# Patient Record
Sex: Female | Born: 1935 | Race: Black or African American | Hispanic: No | Smoking: Never smoker
Health system: Southern US, Community
[De-identification: ages and names within clinical notes are randomized; demographics above are authoritative.]

## PROBLEM LIST (undated history)

## (undated) DIAGNOSIS — E119 Type 2 diabetes mellitus without complications: Secondary | ICD-10-CM

## (undated) DIAGNOSIS — C801 Malignant (primary) neoplasm, unspecified: Secondary | ICD-10-CM

## (undated) DIAGNOSIS — I10 Essential (primary) hypertension: Secondary | ICD-10-CM

## (undated) HISTORY — PX: ABDOMINAL HYSTERECTOMY: SHX81

## (undated) HISTORY — PX: COLONOSCOPY: SHX174

---

## 2017-06-08 ENCOUNTER — Encounter (HOSPITAL_COMMUNITY): Payer: Self-pay

## 2017-06-08 ENCOUNTER — Other Ambulatory Visit: Payer: Self-pay

## 2017-06-08 ENCOUNTER — Observation Stay (HOSPITAL_COMMUNITY)
Admission: EM | Admit: 2017-06-08 | Discharge: 2017-06-09 | Disposition: A | Discharge status: Home / Self Care | Payer: Medicare Other | Attending: Internal Medicine | Admitting: Internal Medicine

## 2017-06-08 ENCOUNTER — Emergency Department (HOSPITAL_COMMUNITY): Payer: Medicare Other

## 2017-06-08 DIAGNOSIS — I4510 Unspecified right bundle-branch block: Secondary | ICD-10-CM | POA: Insufficient documentation

## 2017-06-08 DIAGNOSIS — E876 Hypokalemia: Secondary | ICD-10-CM

## 2017-06-08 DIAGNOSIS — Z794 Long term (current) use of insulin: Secondary | ICD-10-CM | POA: Insufficient documentation

## 2017-06-08 DIAGNOSIS — E785 Hyperlipidemia, unspecified: Secondary | ICD-10-CM | POA: Diagnosis not present

## 2017-06-08 DIAGNOSIS — C541 Malignant neoplasm of endometrium: Secondary | ICD-10-CM | POA: Diagnosis present

## 2017-06-08 DIAGNOSIS — Z90710 Acquired absence of both cervix and uterus: Secondary | ICD-10-CM | POA: Insufficient documentation

## 2017-06-08 DIAGNOSIS — Z8542 Personal history of malignant neoplasm of other parts of uterus: Secondary | ICD-10-CM | POA: Insufficient documentation

## 2017-06-08 DIAGNOSIS — D899 Disorder involving the immune mechanism, unspecified: Secondary | ICD-10-CM | POA: Diagnosis not present

## 2017-06-08 DIAGNOSIS — R079 Chest pain, unspecified: Secondary | ICD-10-CM | POA: Diagnosis present

## 2017-06-08 DIAGNOSIS — E119 Type 2 diabetes mellitus without complications: Secondary | ICD-10-CM | POA: Diagnosis not present

## 2017-06-08 DIAGNOSIS — R0789 Other chest pain: Secondary | ICD-10-CM | POA: Diagnosis present

## 2017-06-08 DIAGNOSIS — I10 Essential (primary) hypertension: Secondary | ICD-10-CM | POA: Diagnosis not present

## 2017-06-08 DIAGNOSIS — K209 Esophagitis, unspecified without bleeding: Secondary | ICD-10-CM

## 2017-06-08 DIAGNOSIS — Z79899 Other long term (current) drug therapy: Secondary | ICD-10-CM | POA: Insufficient documentation

## 2017-06-08 HISTORY — DX: Malignant (primary) neoplasm, unspecified: C80.1

## 2017-06-08 HISTORY — DX: Type 2 diabetes mellitus without complications: E11.9

## 2017-06-08 HISTORY — DX: Essential (primary) hypertension: I10

## 2017-06-08 LAB — BASIC METABOLIC PANEL
ANION GAP: 14 (ref 5–15)
BUN: 12 mg/dL (ref 6–20)
CO2: 20 mmol/L — ABNORMAL LOW (ref 22–32)
Calcium: 8.8 mg/dL — ABNORMAL LOW (ref 8.9–10.3)
Chloride: 97 mmol/L — ABNORMAL LOW (ref 101–111)
Creatinine, Ser: 0.94 mg/dL (ref 0.44–1.00)
GFR calc Af Amer: >60 mL/min (ref >60)
GFR, EST NON AFRICAN AMERICAN: 55 mL/min — AB (ref >60)
Glucose, Bld: 308 mg/dL — ABNORMAL HIGH (ref 65–99)
POTASSIUM: 3.2 mmol/L — AB (ref 3.5–5.1)
SODIUM: 131 mmol/L — AB (ref 135–145)

## 2017-06-08 LAB — I-STAT TROPONIN, ED: TROPONIN I, POC: 0.01 ng/mL (ref 0.00–0.08)

## 2017-06-08 LAB — CBC
HEMATOCRIT: 33.1 % — AB (ref 36.0–46.0)
HEMOGLOBIN: 11.7 g/dL — AB (ref 12.0–15.0)
MCH: 28.4 pg (ref 26.0–34.0)
MCHC: 35.3 g/dL (ref 30.0–36.0)
MCV: 80.3 fL (ref 78.0–100.0)
Platelets: 258 10*3/uL (ref 150–400)
RBC: 4.12 MIL/uL (ref 3.87–5.11)
RDW: 13.2 % (ref 11.5–15.5)
WBC: 1.7 10*3/uL — AB (ref 4.0–10.5)

## 2017-06-08 LAB — CBG MONITORING, ED: Glucose-Capillary: 260 mg/dL — ABNORMAL HIGH (ref 65–99)

## 2017-06-08 MED ORDER — ATORVASTATIN CALCIUM 10 MG PO TABS
10.0000 mg | ORAL_TABLET | Freq: Every day | ORAL | Status: DC
  Administered 2017-06-09: 10 mg via ORAL
  Filled 2017-06-08: qty 1

## 2017-06-08 MED ORDER — INSULIN ASPART 100 UNIT/ML ~~LOC~~ SOLN
0.0000 [IU] | SUBCUTANEOUS | Status: DC
  Administered 2017-06-09: 3 [IU] via SUBCUTANEOUS
  Administered 2017-06-09: 5 [IU] via SUBCUTANEOUS
  Administered 2017-06-09: 3 [IU] via SUBCUTANEOUS
  Filled 2017-06-08 (×2): qty 1

## 2017-06-08 MED ORDER — LEVOTHYROXINE SODIUM 75 MCG PO TABS
75.0000 ug | ORAL_TABLET | Freq: Every day | ORAL | Status: DC
  Administered 2017-06-09: 75 ug via ORAL
  Filled 2017-06-08: qty 1

## 2017-06-08 MED ORDER — BACLOFEN 10 MG PO TABS: 10.0000 mg | ORAL_TABLET | Freq: Every day | ORAL | Status: DC | PRN

## 2017-06-08 MED ORDER — SUCRALFATE 1 G PO TABS
1.0000 g | ORAL_TABLET | Freq: Once | ORAL | Status: AC
  Administered 2017-06-08: 1 g via ORAL
  Filled 2017-06-08: qty 1

## 2017-06-08 MED ORDER — OXYCODONE HCL 5 MG PO TABS: 5.0000 mg | ORAL_TABLET | Freq: Three times a day (TID) | ORAL | Status: DC | PRN

## 2017-06-08 MED ORDER — HYDROCHLOROTHIAZIDE 12.5 MG PO CAPS
12.5000 mg | ORAL_CAPSULE | Freq: Every day | ORAL | Status: DC
  Administered 2017-06-09: 12.5 mg via ORAL
  Filled 2017-06-08: qty 1

## 2017-06-08 NOTE — ED Triage Notes (Signed)
Pt states that she has had a pressure in the center of her chest since breakfast this AM, denies any other cardiac symptoms.

## 2017-06-09 ENCOUNTER — Encounter (HOSPITAL_COMMUNITY): Payer: Self-pay | Admitting: Internal Medicine

## 2017-06-09 DIAGNOSIS — R0789 Other chest pain: Secondary | ICD-10-CM

## 2017-06-09 DIAGNOSIS — R079 Chest pain, unspecified: Secondary | ICD-10-CM | POA: Diagnosis present

## 2017-06-09 DIAGNOSIS — E119 Type 2 diabetes mellitus without complications: Secondary | ICD-10-CM

## 2017-06-09 DIAGNOSIS — E785 Hyperlipidemia, unspecified: Secondary | ICD-10-CM

## 2017-06-09 DIAGNOSIS — E876 Hypokalemia: Secondary | ICD-10-CM | POA: Diagnosis not present

## 2017-06-09 DIAGNOSIS — I10 Essential (primary) hypertension: Secondary | ICD-10-CM

## 2017-06-09 DIAGNOSIS — C541 Malignant neoplasm of endometrium: Secondary | ICD-10-CM | POA: Diagnosis not present

## 2017-06-09 LAB — CBC
HCT: 31.5 % — ABNORMAL LOW (ref 36.0–46.0)
Hemoglobin: 11.1 g/dL — ABNORMAL LOW (ref 12.0–15.0)
MCH: 28.4 pg (ref 26.0–34.0)
MCHC: 35.2 g/dL (ref 30.0–36.0)
MCV: 80.6 fL (ref 78.0–100.0)
Platelets: 216 10*3/uL (ref 150–400)
RBC: 3.91 MIL/uL (ref 3.87–5.11)
RDW: 13 % (ref 11.5–15.5)
WBC: 1.9 10*3/uL — AB (ref 4.0–10.5)

## 2017-06-09 LAB — BASIC METABOLIC PANEL
ANION GAP: 12 (ref 5–15)
BUN: 11 mg/dL (ref 6–20)
CO2: 23 mmol/L (ref 22–32)
Calcium: 8.5 mg/dL — ABNORMAL LOW (ref 8.9–10.3)
Chloride: 98 mmol/L — ABNORMAL LOW (ref 101–111)
Creatinine, Ser: 0.84 mg/dL (ref 0.44–1.00)
Glucose, Bld: 199 mg/dL — ABNORMAL HIGH (ref 65–99)
POTASSIUM: 3.1 mmol/L — AB (ref 3.5–5.1)
Sodium: 133 mmol/L — ABNORMAL LOW (ref 135–145)

## 2017-06-09 LAB — DIFFERENTIAL
Basophils Relative: 2 %
Eosinophils Relative: 6 %
Lymphocytes Relative: 61 %
MONOS PCT: 17 %
Neutrophils Relative %: 14 %

## 2017-06-09 LAB — GLUCOSE, CAPILLARY
GLUCOSE-CAPILLARY: 216 mg/dL — AB (ref 65–99)
Glucose-Capillary: 208 mg/dL — ABNORMAL HIGH (ref 65–99)

## 2017-06-09 LAB — I-STAT TROPONIN, ED: TROPONIN I, POC: 0 ng/mL (ref 0.00–0.08)

## 2017-06-09 LAB — CBG MONITORING, ED: GLUCOSE-CAPILLARY: 215 mg/dL — AB (ref 65–99)

## 2017-06-09 LAB — TROPONIN I: Troponin I: <0.03 ng/mL (ref <0.03)

## 2017-06-09 MED ORDER — POTASSIUM CHLORIDE CRYS ER 20 MEQ PO TBCR
40.0000 meq | EXTENDED_RELEASE_TABLET | Freq: Once | ORAL | Status: AC
  Administered 2017-06-09: 40 meq via ORAL
  Filled 2017-06-09: qty 2

## 2017-06-09 MED ORDER — ONDANSETRON HCL 4 MG/2ML IJ SOLN: 4.0000 mg | Freq: Four times a day (QID) | INTRAMUSCULAR | Status: DC | PRN

## 2017-06-09 MED ORDER — ENOXAPARIN SODIUM 40 MG/0.4ML ~~LOC~~ SOLN
40.0000 mg | SUBCUTANEOUS | Status: DC
  Administered 2017-06-09: 40 mg via SUBCUTANEOUS
  Filled 2017-06-09: qty 0.4

## 2017-06-09 MED ORDER — GI COCKTAIL ~~LOC~~
30.0000 mL | Freq: Three times a day (TID) | ORAL | Status: DC | PRN
  Administered 2017-06-09: 30 mL via ORAL
  Filled 2017-06-09: qty 30

## 2017-06-09 MED ORDER — ACETAMINOPHEN 325 MG PO TABS: 650.0000 mg | ORAL_TABLET | ORAL | Status: DC | PRN

## 2017-06-09 NOTE — Consult Note (Addendum)
Cardiology Consultation:   Patient ID: Natasha Lynch; [id number]; [birth year]   Admit date: 06/08/2017 Date of Consult: 06/09/2017  Primary Care Provider: Patient, No Pcp Per Primary Cardiologist: Dr. Domenic Polite - new Primary Electrophysiologist:     Patient Profile:   Natasha Lynch is a 84 y.o. female with a hx of HTN, DM, and endometrial cancer who is being seen today for the evaluation of chest pain at the request of Dr. Maylene Roes.  History of Present Illness:   Natasha Lynch doe not currently follow with cardiology. She denies previous stress test, heart cath, and open heart surgery. She presented to [facility] with onset of substernal chest pani when she swallows. Cardiology asked to evaluate chest pain.   On my interview, the patient states she started having pain right after a meal yesterday. She has discomfort in her lower chest, she denies "chest pain."  It only hurts when she swallows and quickly resolves after the food reaches her stomach. She denies shortness of breath, lower extremity edema, bleeding problems, recent illness, dizziness, and feelings of syncope. She has not fallen recently. She underwent hysterectomy 1 month ago and is recovering from that surgery. She had her last chemo infusion about 8 days prior.   Troponin x 4 negative, EKG with nonspecific ST changes, no signs of ischemia. CXR negative for acute processes.    Past Medical History:  Diagnosis Date  . Cancer ([place])    endometrial   . Diabetes mellitus without complication ([place])   . Hypertension     Past Surgical History:  Procedure Laterality Date  . ABDOMINAL HYSTERECTOMY    . COLONOSCOPY       Home Medications:  Prior to Admission medications   Medication Sig Start Date End Date Taking? Authorizing Provider  atorvastatin (LIPITOR) 10 MG tablet Take 10 mg by mouth daily. 03/06/17  Yes [provider]  baclofen (LIORESAL) 10 MG tablet Take 10 mg by mouth daily as needed for muscle spasms.  05/17/17  Yes  [provider]  CALCIUM PO Take 1 tablet by mouth daily.   Yes [provider]  Cyanocobalamin (VITAMIN B-12 PO) Take 1 tablet by mouth daily.   Yes [provider]  dexamethasone (DECADRON) 4 MG tablet Take 20 mg by mouth See admin instructions. Take 5 tablets (20 mg) by mouth 12 hours and 6 hours prior to day of chemotherapy 05/29/17  Yes [provider]  glipiZIDE (GLUCOTROL XL) 10 MG 24 hr tablet Take 10 mg by mouth daily. 05/28/17  Yes [provider]  hydrochlorothiazide (MICROZIDE) 12.5 MG capsule Take 12.5 mg by mouth daily. 04/22/17  Yes [provider]  ibuprofen (ADVIL,MOTRIN) 600 MG tablet Take 600 mg by mouth every 6 (six) hours as needed (pain).  05/10/17  Yes [provider]  levothyroxine (SYNTHROID, LEVOTHROID) 75 MCG tablet Take 75 mcg by mouth daily before breakfast. 04/04/17  Yes [provider]  ondansetron (ZOFRAN) 8 MG tablet Take 8 mg by mouth See admin instructions. Dissolve one tablet (8 mg)under the tongue the night of chemo, then take twice daily for 3 days, continue with one tablet (8 mg) three times daily as needed for nausea and vomiting 05/29/17  Yes [provider]  oxyCODONE (OXY IR/ROXICODONE) 5 MG immediate release tablet Take 5 mg by mouth every 8 (eight) hours as needed (pain).  05/10/17  Yes [provider]  prochlorperazine (COMPAZINE) 10 MG tablet Take 10 mg by mouth every 6 (six) hours as needed for nausea or  vomiting.  05/29/17  Yes [provider]    Inpatient Medications: Scheduled Meds: . atorvastatin  10 mg Oral Daily  . enoxaparin (LOVENOX) injection  40 mg Subcutaneous Q24H  . hydrochlorothiazide  12.5 mg Oral Daily  . insulin aspart  0-9 Units Subcutaneous Q4H  . levothyroxine  75 mcg Oral QAC breakfast   Continuous Infusions:  PRN Meds: acetaminophen, baclofen, gi cocktail, ondansetron (ZOFRAN) IV, oxyCODONE  Allergies:   No Known Allergies  Social  History:   Social History   Socioeconomic History  . Marital status: Widowed    Spouse name: Not on file  . Number of children: Not on file  . Years of education: Not on file  . Highest education level: Not on file  Occupational History  . Not on file  Social Needs  . Financial resource strain: Not on file  . Food insecurity:    Worry: Not on file    Inability: Not on file  . Transportation needs:    Medical: Not on file    Non-medical: Not on file  Tobacco Use  . Smoking status: Never Smoker  . Smokeless tobacco: Never Used  Substance and Sexual Activity  . Alcohol use: Never    Frequency: Never  . Drug use: Never  . Sexual activity: Not on file  Lifestyle  . Physical activity:    Days per week: Not on file    Minutes per session: Not on file  . Stress: Not on file  Relationships  . Social connections:    Talks on phone: Not on file    Gets together: Not on file    Attends religious service: Not on file    Active member of club or organization: Not on file    Attends meetings of clubs or organizations: Not on file    Relationship status: Not on file  . Intimate partner violence:    Fear of current or ex partner: Not on file    Emotionally abused: Not on file    Physically abused: Not on file    Forced sexual activity: Not on file  Other Topics Concern  . Not on file  Social History Narrative  . Not on file    Family History:    Family History  Problem Relation Age of Onset  . Diabetes Brother   . Lung cancer Brother      ROS:  Please see the history of present illness.   All other ROS reviewed and negative.     Physical Exam/Data:   Vitals:   06/09/17 0400 06/09/17 0500 06/09/17 0544 06/09/17 0549  BP: 129/69 (!) 159/89 (!) 155/70   Pulse: 73 80 85   Resp: 16 18 18    Temp:   98.9 F (37.2 C)   TempSrc:   Oral   SpO2: 97% 94% 96%   Weight:    182 lb 9.6 oz (82.8 kg)  Height:    5\' 8"  (1.727 m)    Intake/Output Summary (Last 24 hours) at  06/09/2017 0716 Last data filed at 06/09/2017 0600 Gross per 24 hour  Intake -  Output 300 ml  Net -300 ml   Filed Weights   06/08/17 2252 06/09/17 0549  Weight: 193 lb (87.5 kg) 182 lb 9.6 oz (82.8 kg)   Body mass index is 27.76 kg/m.  General:  Well nourished, well developed, in no acute distress HEENT: normal Neck: no JVD Vascular: No carotid bruits Cardiac:  normal S1, S2; RRR; no murmur Lungs:  clear to auscultation bilaterally, no wheezing, rhonchi or rales  Abd: soft, nontender, no hepatomegaly  Ext: no edema Musculoskeletal:  No deformities, BUE and BLE strength normal and equal Skin: warm and dry  Neuro:  CNs 2-12 intact, no focal abnormalities noted Psych:  Normal affect   EKG:  The EKG was personally reviewed and demonstrates:  Sinus, nonspecific ST changes, LVH, no prior to compare (LVH mentioned previously) Telemetry:  Telemetry was personally reviewed and demonstrates:  sinus  Relevant CV Studies:  None  Laboratory Data:  Chemistry Recent Labs  Lab 06/08/17 2014 06/09/17 0528  NA 131* 133*  K 3.2* 3.1*  CL 97* 98*  CO2 20* 23  GLUCOSE 308* 199*  BUN 12 11  CREATININE 0.94 0.84  CALCIUM 8.8* 8.5*  GFRNONAA 55* >60  GFRAA >60 >60  ANIONGAP 14 12    No results for input(s): PROT, ALBUMIN, AST, ALT, ALKPHOS, BILITOT in the last 168 hours. Hematology Recent Labs  Lab 06/08/17 2014 06/09/17 0528  WBC 1.7* 1.9*  RBC 4.12 3.91  HGB 11.7* 11.1*  HCT 33.1* 31.5*  MCV 80.3 80.6  MCH 28.4 28.4  MCHC 35.3 35.2  RDW 13.2 13.0  PLT 258 216   Cardiac Enzymes Recent Labs  Lab 06/09/17 0022 06/09/17 0528  TROPONINI <0.03 <0.03    Recent Labs  Lab 06/08/17 2059 06/08/17 2350  TROPIPOC 0.01 0.00    BNPNo results for input(s): BNP, PROBNP in the last 168 hours.  DDimer No results for input(s): DDIMER in the last 168 hours.  Radiology/Studies:  Dg Chest 2 View  Result Date: 06/08/2017 CLINICAL DATA:  Chest pain EXAM: CHEST - 2 VIEW  COMPARISON:  None. FINDINGS: The heart size and mediastinal contours are within normal limits. Both lungs are clear. Mild degenerative changes of the spine. Prominent spur or possible loose body inferior to the right acromion. IMPRESSION: No active cardiopulmonary disease. Electronically Signed   By: Donavan Foil M.D.   On: 06/08/2017 20:30    Assessment and Plan:   1. Chest pressure - troponin x 4 negative - EKG with T wave flattening in V4/5, possible LVH - previously mentioned in EKG reads in CareEverywhere - CXR negative for cardiopulmonary disease - This chest discomfort only occurs when swallowing, resolves when food/drink reaches her stomach, and started right after a meal. Given her negative enzymes and nonacute EKG, have low suspicion for an ACS process. Would consult GI for possible esophagitis. Carafate helped her symptoms. OK to have a diet from cardiology perspective.   2. Hypokalemia - K 3.2, now 3.1 - will replace with potassium 40 mEq OTO, repeat BMP tomorrow   3. HLD - continue home lipitor, no lipid panel in Epic, follow with PCP   For questions or updates, please contact [place] Please consult [email or web address] for contact info under Cardiology/STEMI.   Signed, Ledora Bottcher, [place]  06/09/2017 7:16 AM   Patient examined chart reviewed Discussed care with PA. Atypical chest pain more related to swallowing and related To food intake She has no acute ECG changes and negative troponins CXR NAD Exam with obese black female no murmur Clear lungs abdomen soft no edema palpable pedal pulses. No indications for further inpatient cardiac evaluation Would have GI evaluate and agree EGD would be next logical step  Jenkins Rouge

## 2017-06-09 NOTE — Discharge Summary (Signed)
Physician Discharge Summary  Natasha Lynch ATF:[id number] DOB: 05/07/1932 DOA: 06/08/2017  PCP: Dr. Letha Cape MD. PCP in [place], [place]. She is currently in [place] to visit for a wedding.   Admit date: 06/08/2017 Discharge date: 06/09/2017  Admitted From: Home Disposition:  Home   Recommendations for Outpatient Follow-up:  1. Follow up with PCP in 1 week 2. Recommend outpatient GI referral to evaluate for EGD and esophagitis.  3. Please obtain BMP in 1 week. Hypokalemia replaced prior to discharge home.   Discharge Condition: Stable CODE STATUS: Full  Diet recommendation: Heart healthy   Brief/Interim Summary: Natasha Lynch is a 84 y.o. female with medical history significant of DM2, HTN, endometrial cancer s/p TAH/BSO lymph node positive, currently undergoing chemotherapy.  Last had carboplatin and taxol on 4/12. Patient presents to the ED with c/o chest pressure.  Symptoms onset after brunch this AM.  No SOB.  Chest pressure is now only present / worse when she swallows.  It is not associated with exertion. Troponin x 4 negative. EKG shows diffuse non-specific T wave flattening.  Patient was observed overnight.  She stated that her pain was more described as a "bolus"sensation in her mid chest associated with swallowing food.  She tolerated breakfast this morning and symptom was mildly improved with GI cocktail and Carafate that she had been given.  Cardiology was also consulted.  Given her negative troponin, nonacute EKG, they had low suspicion for ACS process.  They recommend GI evaluation for EGD for possible esophagitis.  Patient is currently visiting from out of town for a wedding.  Cardiology recommendation was reviewed with patient.  She feels comfortable going home today and to follow-up with her primary care physician for an outpatient GI referral.  We discussed concerning symptoms such as worsening chest pain, shortness of breath, diaphoresis which would require reevaluation to  the nearest emergency department.  Discharge Diagnoses:  Principal Problem:   Chest pain, rule out acute myocardial infarction Active Problems:   HTN (hypertension)   DM2 (diabetes mellitus, type 2) (HCC)   Endometrial carcinoma (HCC)   Hypokalemia   HLD (hyperlipidemia)  Discharge Instructions  Discharge Instructions    Call MD for:   Complete by:  As directed    Worsening pain with swallowing food or liquids   Call MD for:  difficulty breathing, headache or visual disturbances   Complete by:  As directed    Call MD for:  extreme fatigue   Complete by:  As directed    Call MD for:  hives   Complete by:  As directed    Call MD for:  persistant dizziness or light-headedness   Complete by:  As directed    Call MD for:  persistant nausea and vomiting   Complete by:  As directed    Call MD for:  severe uncontrolled pain   Complete by:  As directed    Call MD for:  temperature >100.4   Complete by:  As directed    Diet - low sodium heart healthy   Complete by:  As directed    Discharge instructions   Complete by:  As directed    You were cared for by a hospitalist during your hospital stay. If you have any questions about your discharge medications or the care you received while you were in the hospital after you are discharged, you can call the unit and asked to speak with the hospitalist on call if the hospitalist that took care of you is  not available. Once you are discharged, your primary care physician will handle any further medical issues. Please note that NO REFILLS for any discharge medications will be authorized once you are discharged, as it is imperative that you return to your primary care physician (or establish a relationship with a primary care physician if you do not have one) for your aftercare needs so that they can reassess your need for medications and monitor your lab values.   Increase activity slowly   Complete by:  As directed      Allergies as of  06/09/2017   No Known Allergies     Medication List    STOP taking these medications   ibuprofen 600 MG tablet Commonly known as:  ADVIL,MOTRIN     TAKE these medications   atorvastatin 10 MG tablet Commonly known as:  LIPITOR Take 10 mg by mouth daily.   baclofen 10 MG tablet Commonly known as:  LIORESAL Take 10 mg by mouth daily as needed for muscle spasms.   CALCIUM PO Take 1 tablet by mouth daily.   dexamethasone 4 MG tablet Commonly known as:  DECADRON Take 20 mg by mouth See admin instructions. Take 5 tablets (20 mg) by mouth 12 hours and 6 hours prior to day of chemotherapy   glipiZIDE 10 MG 24 hr tablet Commonly known as:  GLUCOTROL XL Take 10 mg by mouth daily.   hydrochlorothiazide 12.5 MG capsule Commonly known as:  MICROZIDE Take 12.5 mg by mouth daily.   levothyroxine 75 MCG tablet Commonly known as:  SYNTHROID, LEVOTHROID Take 75 mcg by mouth daily before breakfast.   ondansetron 8 MG tablet Commonly known as:  ZOFRAN Take 8 mg by mouth See admin instructions. Dissolve one tablet (8 mg)under the tongue the night of chemo, then take twice daily for 3 days, continue with one tablet (8 mg) three times daily as needed for nausea and vomiting   oxyCODONE 5 MG immediate release tablet Commonly known as:  Oxy IR/ROXICODONE Take 5 mg by mouth every 8 (eight) hours as needed (pain).   prochlorperazine 10 MG tablet Commonly known as:  COMPAZINE Take 10 mg by mouth every 6 (six) hours as needed for nausea or vomiting.   VITAMIN B-12 PO Take 1 tablet by mouth daily.      Follow-up Information    Follow up with your PCP in [place]. Schedule an appointment as soon as possible for a visit in 1 week(s).   Why:  Recommend outpatient GI referral to evaluate for EGD and esophagitis and to repeat BMP to recheck potassium level          No Known Allergies  Consultations:  Cardiology   Procedures/Studies: Dg Chest 2 View  Result Date:  06/08/2017 CLINICAL DATA:  Chest pain EXAM: CHEST - 2 VIEW COMPARISON:  None. FINDINGS: The heart size and mediastinal contours are within normal limits. Both lungs are clear. Mild degenerative changes of the spine. Prominent spur or possible loose body inferior to the right acromion. IMPRESSION: No active cardiopulmonary disease. Electronically Signed   By: Donavan Foil M.D.   On: 06/08/2017 20:30       Discharge Exam: Vitals:   06/09/17 0544 06/09/17 0819  BP: (!) 155/70 127/76  Pulse: 85 75  Resp: 18 18  Temp: 98.9 F (37.2 C)   SpO2: 96% 98%    General: Pt is alert, awake, not in acute distress Cardiovascular: RRR, S1/S2 +, no rubs, no gallops Respiratory: CTA bilaterally, no wheezing,  no rhonchi Abdominal: Soft, NT, ND, bowel sounds + Extremities: no edema, no cyanosis    The results of significant diagnostics from this hospitalization (including imaging, microbiology, ancillary and laboratory) are listed below for reference.     Microbiology: No results found for this or any previous visit (from the past 240 hour(s)).   Labs: BNP (last 3 results) No results for input(s): BNP in the last 8760 hours. Basic Metabolic Panel: Recent Labs  Lab 06/08/17 2014 06/09/17 0528  NA 131* 133*  K 3.2* 3.1*  CL 97* 98*  CO2 20* 23  GLUCOSE 308* 199*  BUN 12 11  CREATININE 0.94 0.84  CALCIUM 8.8* 8.5*   Liver Function Tests: No results for input(s): AST, ALT, ALKPHOS, BILITOT, PROT, ALBUMIN in the last 168 hours. No results for input(s): LIPASE, AMYLASE in the last 168 hours. No results for input(s): AMMONIA in the last 168 hours. CBC: Recent Labs  Lab 06/08/17 2014 06/09/17 0528  WBC 1.7* 1.9*  HGB 11.7* 11.1*  HCT 33.1* 31.5*  MCV 80.3 80.6  PLT 258 216   Cardiac Enzymes: Recent Labs  Lab 06/09/17 0022 06/09/17 0528  TROPONINI <0.03 <0.03   BNP: Invalid input(s): POCBNP CBG: Recent Labs  Lab 06/08/17 2352 06/09/17 0336 06/09/17 0800 06/09/17 1136   GLUCAP 260* 215* 216* 208*   D-Dimer No results for input(s): DDIMER in the last 72 hours. Hgb A1c No results for input(s): HGBA1C in the last 72 hours. Lipid Profile No results for input(s): CHOL, HDL, LDLCALC, TRIG, CHOLHDL, LDLDIRECT in the last 72 hours. Thyroid function studies No results for input(s): TSH, T4TOTAL, T3FREE, THYROIDAB in the last 72 hours.  Invalid input(s): FREET3 Anemia work up No results for input(s): VITAMINB12, FOLATE, FERRITIN, TIBC, IRON, RETICCTPCT in the last 72 hours. Urinalysis No results found for: COLORURINE, APPEARANCEUR, LABSPEC, [place], GLUCOSEU, HGBUR, BILIRUBINUR, KETONESUR, PROTEINUR, UROBILINOGEN, NITRITE, LEUKOCYTESUR Sepsis Labs Invalid input(s): PROCALCITONIN,  WBC,  LACTICIDVEN Microbiology No results found for this or any previous visit (from the past 240 hour(s)).   Patient was seen and examined on the day of discharge and was found to be in stable condition. Time coordinating discharge: 35 minutes including assessment and coordination of care, as well as examination of the patient.   SIGNED:  Dessa Phi, DO [facility] Pager [phone]  If 7PM-7AM, please contact night-coverage [email or web address] Password [facility] 06/09/2017, 3:07 PM

## 2017-06-09 NOTE — Progress Notes (Signed)
D/c instructions given to patient. Telemetry removed. Iv removed, clean and intact. Daughter to escort pt home.  Clyde Canterbury, RN

## 2017-06-09 NOTE — ED Provider Notes (Signed)
[facility] EMERGENCY DEPARTMENT Provider Note   CSN: [id number] Arrival date & time: 06/08/17  1950     History   Chief Complaint Chief Complaint  Patient presents with  . Chest Pain    HPI Natasha Lynch is a 84 y.o. female.  HPI 84 year old female comes in with chief complaint of substernal chest pain. Patient states that she started having this discomfort around brunch time.  Patient tells me that she has a constant gas-like feeling, and when she swallows she has pain in the substernal part of her chest.  There is no radiation of the discomfort, and there is no associated nausea, shortness of breath or diaphoresis.  Patient denies any history of similar symptoms in the past.  Patient also denies any exertional component or positional component to her discomfort.  Patient's past medical history significant for hypertension, diabetes and also endometrial cancer status post hysterectomy 1 month ago.  Patient is on chemotherapy and her last session was about 10 days ago.  Patient denies any diagnosis of esophagitis/gastritis.  Patient also denies any history of gas.  Review of system is negative for fevers, chills, regurgitation of the food.   Past Medical History:  Diagnosis Date  . Cancer ([place])    endometrial   . Diabetes mellitus without complication ([place])   . Hypertension     Patient Active Problem List   Diagnosis Date Noted  . Chest pain, rule out acute myocardial infarction 06/09/2017  . HTN (hypertension) 06/09/2017  . DM2 (diabetes mellitus, type 2) ([place]) 06/09/2017    Past Surgical History:  Procedure Laterality Date  . ABDOMINAL HYSTERECTOMY    . COLONOSCOPY       OB History   None      Home Medications    Prior to Admission medications   Medication Sig Start Date End Date Taking? Authorizing Provider  atorvastatin (LIPITOR) 10 MG tablet Take 10 mg by mouth daily. 03/06/17  Yes [provider]  baclofen (LIORESAL) 10 MG  tablet Take 10 mg by mouth daily as needed for muscle spasms.  05/17/17  Yes [provider]  CALCIUM PO Take 1 tablet by mouth daily.   Yes [provider]  Cyanocobalamin (VITAMIN B-12 PO) Take 1 tablet by mouth daily.   Yes [provider]  dexamethasone (DECADRON) 4 MG tablet Take 20 mg by mouth See admin instructions. Take 5 tablets (20 mg) by mouth 12 hours and 6 hours prior to day of chemotherapy 05/29/17  Yes [provider]  glipiZIDE (GLUCOTROL XL) 10 MG 24 hr tablet Take 10 mg by mouth daily. 05/28/17  Yes [provider]  hydrochlorothiazide (MICROZIDE) 12.5 MG capsule Take 12.5 mg by mouth daily. 04/22/17  Yes [provider]  ibuprofen (ADVIL,MOTRIN) 600 MG tablet Take 600 mg by mouth every 6 (six) hours as needed (pain).  05/10/17  Yes [provider]  levothyroxine (SYNTHROID, LEVOTHROID) 75 MCG tablet Take 75 mcg by mouth daily before breakfast. 04/04/17  Yes [provider]  ondansetron (ZOFRAN) 8 MG tablet Take 8 mg by mouth See admin instructions. Dissolve one tablet (8 mg)under the tongue the night of chemo, then take twice daily for 3 days, continue with one tablet (8 mg) three times daily as needed for nausea and vomiting 05/29/17  Yes [provider]  oxyCODONE (OXY IR/ROXICODONE) 5 MG immediate release tablet Take 5 mg by mouth every 8 (eight) hours as needed (pain).  05/10/17  Yes [provider]  prochlorperazine (COMPAZINE) 10 MG tablet Take 10 mg by mouth every 6 (six) hours as needed for nausea or vomiting.  05/29/17  Yes [provider]    Family History Family History  Problem Relation Age of Onset  . Diabetes Brother   . Lung cancer Brother     Social History Social History   Tobacco Use  . Smoking status: Never Smoker  . Smokeless tobacco: Never Used  Substance Use Topics  . Alcohol use: Never    Frequency: Never  . Drug use: Never     Allergies   Patient has no  known allergies.   Review of Systems Review of Systems  Constitutional: Positive for activity change.  Respiratory: Positive for chest tightness. Negative for shortness of breath.   Cardiovascular: Positive for chest pain.  Gastrointestinal: Negative for nausea.  Skin: Negative for rash.  Allergic/Immunologic: Positive for immunocompromised state.  Hematological: Does not bruise/bleed easily.     Physical Exam Updated Vital Signs BP (!) 142/66   Pulse 77   Temp 98.5 F (36.9 C) (Oral)   Resp 16   Ht 5' 8.5" (1.74 m)   Wt 87.5 kg (193 lb)   SpO2 100%   BMI 28.92 kg/m   Physical Exam  Constitutional: She is oriented to person, place, and time. She appears well-developed.  HENT:  Head: Normocephalic and atraumatic.  Eyes: EOM are normal.  Neck: Normal range of motion. Neck supple.  Cardiovascular: Normal rate, intact distal pulses and normal pulses.  Pulmonary/Chest: Effort normal.  Abdominal: Bowel sounds are normal.  Musculoskeletal:       Right lower leg: She exhibits no tenderness and no edema.       Left lower leg: She exhibits no tenderness and no edema.  Neurological: She is alert and oriented to person, place, and time.  Skin: Skin is warm and dry.  Nursing note and vitals reviewed.    ED Treatments / Results  Labs (all labs ordered are listed, but only abnormal results are displayed) Labs Reviewed  BASIC METABOLIC PANEL - Abnormal; Notable for the following components:      Result Value   Sodium 131 (*)    Potassium 3.2 (*)    Chloride 97 (*)    CO2 20 (*)    Glucose, Bld 308 (*)    Calcium 8.8 (*)    GFR calc non Af Amer 55 (*)    All other components within normal limits  CBC - Abnormal; Notable for the following components:   WBC 1.7 (*)    Hemoglobin 11.7 (*)    HCT 33.1 (*)    All other components within normal limits  CBG MONITORING, ED - Abnormal; Notable for the following components:   Glucose-Capillary 260 (*)    All other components  within normal limits  DIFFERENTIAL  TROPONIN I  TROPONIN I  TROPONIN I  I-STAT TROPONIN, ED  I-STAT TROPONIN, ED    EKG EKG Interpretation  Date/Time:  Saturday June 08 2017 20:00:56 EDT Ventricular Rate:  89 PR Interval:  118 QRS Duration: 98 QT Interval:  362 QTC Calculation: 440 R Axis:   -6 Text Interpretation:  Normal sinus rhythm Incomplete right bundle branch block Minimal voltage criteria for LVH, may be normal variant Nonspecific ST and T wave abnormality Abnormal ECG No acute changes Nonspecific ST and T wave abnormality No old tracing to compare Confirmed by Varney Biles [phone]) on 06/08/2017 11:09:39 PM   Radiology Dg Chest 2 View  Result  Date: 06/08/2017 CLINICAL DATA:  Chest pain EXAM: CHEST - 2 VIEW COMPARISON:  None. FINDINGS: The heart size and mediastinal contours are within normal limits. Both lungs are clear. Mild degenerative changes of the spine. Prominent spur or possible loose body inferior to the right acromion. IMPRESSION: No active cardiopulmonary disease. Electronically Signed   By: Donavan Foil M.D.   On: 06/08/2017 20:30    Procedures Procedures (including critical care time)  Medications Ordered in ED Medications  atorvastatin (LIPITOR) tablet 10 mg (has no administration in time range)  baclofen (LIORESAL) tablet 10 mg (has no administration in time range)  hydrochlorothiazide (MICROZIDE) capsule 12.5 mg (has no administration in time range)  levothyroxine (SYNTHROID, LEVOTHROID) tablet 75 mcg (has no administration in time range)  oxyCODONE (Oxy IR/ROXICODONE) immediate release tablet 5 mg (has no administration in time range)  insulin aspart (novoLOG) injection 0-9 Units (5 Units Subcutaneous Given 06/09/17 0002)  acetaminophen (TYLENOL) tablet 650 mg (has no administration in time range)  ondansetron (ZOFRAN) injection 4 mg (has no administration in time range)  gi cocktail (Maalox,Lidocaine,Donnatal) (has no administration in time range)   enoxaparin (LOVENOX) injection 40 mg (has no administration in time range)  sucralfate (CARAFATE) tablet 1 g (1 g Oral Given 06/08/17 2332)     Initial Impression / Assessment and Plan / ED Course  I have reviewed the triage vital signs and the nursing notes.  Pertinent labs & imaging results that were available during my care of the patient were reviewed by me and considered in my medical decision making (see chart for details).     Differential diagnosis includes: ACS syndrome Myocarditis Pericarditis Pericardial effusion / tamponade Pneumonia Pleural effusion / Pulmonary edema PE Pneumothorax Musculoskeletal pain PUD / Gastritis / Esophagitis Esophageal spasm   84 year old female comes in with chief complaint of chest pain.  Patient has substernal chest discomfort, and there is worsening of her symptoms with swallowing.  Patient has history of diabetes, hypertension and she also is on chemotherapy.  Based on history alone clinical suspicion is highest for esophagitis versus atypical presentation of  ACS.  Patient resides near [place] and gets her care at outside system.  On a Saturday night it would be difficult for me to get any kind of follow-up for this patient.  I spoke about the case with Dr. Radford Pax, cardiologist  -and she thinks it will be best to admit patient has chest pain observation status with the hospitalist team.  Final Clinical Impressions(s) / ED Diagnoses   Final diagnoses:  Esophagitis  Atypical chest pain    ED Discharge Orders    None       Varney Biles, MD 06/09/17 [phone]

## 2017-06-09 NOTE — H&P (Signed)
History and Physical    Natasha Lynch CWC:[id number] DOB: 1933-02-13 DOA: 06/08/2017  PCP: No primary care provider on file.  Patient coming from: Home  I have personally briefly reviewed patient's old medical records in [place]  Chief Complaint: Chest pressure  HPI: Natasha Lynch is a 84 y.o. female with medical history significant of DM2, HTN, endometrial Cancer s/p TAH/BSO lymph node positive, currently undergoing chemotherapy.  Last had carboplatin and taxol on 4/12.  Patient presents to the ED with c/o chest pressure.  Symptoms onset after brunch this AM.  No SOB.  Chest pressure is now only present / worse when she swallows.  It is not associated with exertion.   ED Course: Trop neg x1, EKG shows diffuse non-specific T wave flattening.  WBC 1.7, BGL 308.  EDP spoke with cards on call and they recommended CP obs admit.   Review of Systems: As per HPI otherwise 10 point review of systems negative.   Past Medical History:  Diagnosis Date  . Cancer ([place])    endometrial   . Diabetes mellitus without complication ([place])   . Hypertension     Past Surgical History:  Procedure Laterality Date  . ABDOMINAL HYSTERECTOMY    . COLONOSCOPY       reports that she has never smoked. She has never used smokeless tobacco. She reports that she does not drink alcohol or use drugs.  No Known Allergies  Family History  Problem Relation Age of Onset  . Diabetes Brother   . Lung cancer Brother      Prior to Admission medications   Medication Sig Start Date End Date Taking? Authorizing Provider  atorvastatin (LIPITOR) 10 MG tablet Take 10 mg by mouth daily. 03/06/17  Yes [provider]  baclofen (LIORESAL) 10 MG tablet Take 10 mg by mouth daily as needed for muscle spasms.  05/17/17  Yes [provider]  CALCIUM PO Take 1 tablet by mouth daily.   Yes [provider]  Cyanocobalamin (VITAMIN B-12 PO) Take 1 tablet by mouth daily.   Yes [provider]  dexamethasone (DECADRON) 4 MG tablet Take 20 mg by mouth See admin instructions. Take 5 tablets (20 mg) by mouth 12 hours and 6 hours prior to day of chemotherapy 05/29/17  Yes [provider]  glipiZIDE (GLUCOTROL XL) 10 MG 24 hr tablet Take 10 mg by mouth daily. 05/28/17  Yes [provider]  hydrochlorothiazide (MICROZIDE) 12.5 MG capsule Take 12.5 mg by mouth daily. 04/22/17  Yes [provider]  ibuprofen (ADVIL,MOTRIN) 600 MG tablet Take 600 mg by mouth every 6 (six) hours as needed (pain).  05/10/17  Yes [provider]  levothyroxine (SYNTHROID, LEVOTHROID) 75 MCG tablet Take 75 mcg by mouth daily before breakfast. 04/04/17  Yes [provider]  ondansetron (ZOFRAN) 8 MG tablet Take 8 mg by mouth See admin instructions. Dissolve one tablet (8 mg)under the tongue the night of chemo, then take twice daily for 3 days, continue with one tablet (8 mg) three times daily as needed for nausea and vomiting 05/29/17  Yes [provider]  oxyCODONE (OXY IR/ROXICODONE) 5 MG immediate release tablet Take 5 mg by mouth every 8 (eight) hours as needed (pain).  05/10/17  Yes [provider]  prochlorperazine (COMPAZINE) 10 MG tablet Take 10 mg by mouth every 6 (six) hours as needed for nausea or vomiting.  05/29/17  Yes [provider]    Physical Exam: Vitals:   06/08/17  2233 06/08/17 2252 06/08/17 2300 06/08/17 2330  BP: (!) 154/86  130/68 (!) 142/66  Pulse: 78  75 77  Resp: 16  14 16   Temp:      TempSrc:      SpO2: 100%  98% 100%  Weight:  87.5 kg (193 lb)    Height:  5' 8.5" (1.74 m)      Constitutional: NAD, calm, comfortable Eyes: PERRL, lids and conjunctivae normal ENMT: Mucous membranes are moist. Posterior pharynx clear of any exudate or lesions.Normal dentition.  Neck: normal, supple, no masses, no thyromegaly Respiratory: clear to auscultation bilaterally, no wheezing, no crackles. Normal respiratory effort.  No accessory muscle use.  Cardiovascular: Regular rate and rhythm, no murmurs / rubs / gallops. No extremity edema. 2+ pedal pulses. No carotid bruits.  Abdomen: no tenderness, no masses palpated. No hepatosplenomegaly. Bowel sounds positive.  Musculoskeletal: no clubbing / cyanosis. No joint deformity upper and lower extremities. Good ROM, no contractures. Normal muscle tone.  Skin: no rashes, lesions, ulcers. No induration Neurologic: CN 2-12 grossly intact. Sensation intact, DTR normal. Strength 5/5 in all 4.  Psychiatric: Normal judgment and insight. Alert and oriented x 3. Normal mood.    Labs on Admission: I have personally reviewed following labs and imaging studies  CBC: Recent Labs  Lab 06/08/17 2014  WBC 1.7*  HGB 11.7*  HCT 33.1*  MCV 80.3  PLT 875   Basic Metabolic Panel: Recent Labs  Lab 06/08/17 2014  NA 131*  K 3.2*  CL 97*  CO2 20*  GLUCOSE 308*  BUN 12  CREATININE 0.94  CALCIUM 8.8*   GFR: Estimated Creatinine Clearance: 54 mL/min (by C-G formula based on SCr of 0.94 mg/dL). Liver Function Tests: No results for input(s): AST, ALT, ALKPHOS, BILITOT, PROT, ALBUMIN in the last 168 hours. No results for input(s): LIPASE, AMYLASE in the last 168 hours. No results for input(s): AMMONIA in the last 168 hours. Coagulation Profile: No results for input(s): INR, PROTIME in the last 168 hours. Cardiac Enzymes: No results for input(s): CKTOTAL, CKMB, CKMBINDEX, TROPONINI in the last 168 hours. BNP (last 3 results) No results for input(s): PROBNP in the last 8760 hours. HbA1C: No results for input(s): HGBA1C in the last 72 hours. CBG: Recent Labs  Lab 06/08/17 2352  GLUCAP 260*   Lipid Profile: No results for input(s): CHOL, HDL, LDLCALC, TRIG, CHOLHDL, LDLDIRECT in the last 72 hours. Thyroid Function Tests: No results for input(s): TSH, T4TOTAL, FREET4, T3FREE, THYROIDAB in the last 72 hours. Anemia Panel: No results for input(s): VITAMINB12, FOLATE,  FERRITIN, TIBC, IRON, RETICCTPCT in the last 72 hours. Urine analysis: No results found for: COLORURINE, APPEARANCEUR, LABSPEC, PHURINE, GLUCOSEU, HGBUR, BILIRUBINUR, KETONESUR, PROTEINUR, UROBILINOGEN, NITRITE, LEUKOCYTESUR  Radiological Exams on Admission: Dg Chest 2 View  Result Date: 06/08/2017 CLINICAL DATA:  Chest pain EXAM: CHEST - 2 VIEW COMPARISON:  None. FINDINGS: The heart size and mediastinal contours are within normal limits. Both lungs are clear. Mild degenerative changes of the spine. Prominent spur or possible loose body inferior to the right acromion. IMPRESSION: No active cardiopulmonary disease. Electronically Signed   By: Donavan Foil M.D.   On: 06/08/2017 20:30    EKG: Independently reviewed.  Assessment/Plan Principal Problem:   Chest pain, rule out acute myocardial infarction Active Problems:   HTN (hypertension)   DM2 (diabetes mellitus, type 2) ([facility])   Endometrial carcinoma ([facility])    1. CP r/o - though I'm more suspicious of esophagitis in this patient given HPI  and work up thus far. 1. CP obs pathway 2. Serial trops 3. Tele monitor 4. NPO 5. Cards eval in AM 6. Will try GI cocktail to see if this helps. 2. HTN - 1. Continue home BP meds 3. DM2 - 1. Hold glipizide 2. Sensitive SSI Q4H 4. Endometrial carcinoma - 1. Chemo last given 4/12 2. Repeat CBC/BMP in AM and keep eye on neutropenia  DVT prophylaxis: Lovenox Code Status: Full Family Communication: Husband at bedside Disposition Plan: Home after cards ruleout Consults called: EDP spoke with cards on call who recd observation, message put into [facility] for eval in AM Admission status: Place in [place], [place] Hospital[facility] Pager [phone]  If 7AM-7PM, please contact day team taking care of patient [email or web address] Password [facility]  06/09/2017, 12:05 AM

## 2017-06-09 NOTE — Discharge Instructions (Signed)
Esophagitis Esophagitis is inflammation of the esophagus. The esophagus is the tube that carries food and liquids from your mouth to your stomach. Esophagitis can cause soreness or pain in the esophagus. This condition can make it difficult and painful to swallow. What are the causes? Most causes of esophagitis are not serious. Common causes of this condition include:  Gastroesophageal reflux disease (GERD). This is when stomach contents move back up into the esophagus (reflux).  Repeated vomiting.  An allergic-type reaction, especially caused by food allergies (eosinophilic esophagitis).  Injury to the esophagus by swallowing large pills with or without water, or swallowing certain types of medicines.  Swallowing (ingesting) harmful chemicals, such as household cleaning products.  Heavy alcohol use.  An infection of the esophagus.This most often occurs in people who have a weakened immune system.  Radiation or chemotherapy treatment for cancer.  Certain diseases such as sarcoidosis, Crohn disease, and scleroderma.  What are the signs or symptoms? Symptoms of this condition include:  Difficult or painful swallowing.  Pain with swallowing acidic liquids, such as citrus juices.  Pain with burping.  Chest pain.  Difficulty breathing.  Nausea.  Vomiting.  Pain in the abdomen.  Weight loss.  Ulcers in the mouth.  Patches of white material in the mouth (candidiasis).  Fever.  Coughing up blood or vomiting blood.  Stool that is black, tarry, or bright red.  How is this diagnosed? Your health care provider will take a medical history and perform a physical exam. You may also have other tests, including:  An endoscopy to examine your stomach and esophagus with a small camera.  A test that measures the acidity level in your esophagus.  A test that measures how much pressure is on your esophagus.  A barium swallow or modified barium swallow to show the shape,  size, and functioning of your esophagus.  Allergy tests.  How is this treated? Treatment for this condition depends on the cause of your esophagitis. In some cases, steroids or other medicines may be given to help relieve your symptoms or to treat the underlying cause of your condition. You may have to make some lifestyle changes, such as:  Avoiding alcohol.  Quitting smoking.  Changing your diet.  Exercising.  Changing your sleep habits and your sleep environment.  Follow these instructions at home: Take these actions to decrease your discomfort and to help avoid complications. Diet  Follow a diet as recommended by your health care provider. This may involve avoiding foods and drinks such as: ? Coffee and tea (with or without caffeine). ? Drinks that contain alcohol. ? Energy drinks and sports drinks. ? Carbonated drinks or sodas. ? Chocolate and cocoa. ? Peppermint and mint flavorings. ? Garlic and onions. ? Horseradish. ? Spicy and acidic foods, including peppers, chili powder, curry powder, vinegar, hot sauces, and barbecue sauce. ? Citrus fruit juices and citrus fruits, such as oranges, lemons, and limes. ? Tomato-based foods, such as red sauce, chili, salsa, and pizza with red sauce. ? Fried and fatty foods, such as donuts, french fries, potato chips, and high-fat dressings. ? High-fat meats, such as hot dogs and fatty cuts of red and white meats, such as rib eye steak, sausage, ham, and bacon. ? High-fat dairy items, such as whole milk, butter, and cream cheese.  Eat small, frequent meals instead of large meals.  Avoid drinking large amounts of liquid with your meals.  Avoid eating meals during the 2-3 hours before bedtime.  Avoid lying down right   after you eat.  Do not exercise right after you eat.  Avoid foods and drinks that seem to make your symptoms worse. General instructions  Pay attention to any changes in your symptoms.  Take over-the-counter and  prescription medicines only as told by your health care provider. Do not take aspirin, ibuprofen, or other NSAIDs unless your health care provider told you to do so.  If you have trouble taking pills, use a pill splitter to decrease the size of the pill. This will decrease the chance of the pill getting stuck or injuring your esophagus on the way down. Also, drink water after you take a pill.  Do not use any tobacco products, including cigarettes, chewing tobacco, and e-cigarettes. If you need help quitting, ask your health care provider.  Wear loose-fitting clothing. Do not wear anything tight around your waist that causes pressure on your abdomen.  Raise (elevate) the head of your bed about 6 inches (15 cm).  Try to reduce your stress, such as with yoga or meditation. If you need help reducing stress, ask your health care provider.  If you are overweight, reduce your weight to an amount that is healthy for you. Ask your health care provider for guidance about a safe weight loss goal.  Keep all follow-up visits as told by your health care provider. This is important. Contact a health care provider if:  You have new symptoms.  You have unexplained weight loss.  You have difficulty swallowing, or it hurts to swallow.  You have wheezing or a persistent cough.  Your symptoms do not improve with treatment.  You have frequent heartburn for more than two weeks. Get help right away if:  You have severe pain in your arms, neck, jaw, teeth, or back.  You feel sweaty, dizzy, or light-headed.  You have chest pain or shortness of breath.  You vomit and your vomit looks like blood or coffee grounds.  Your stool is bloody or black.  You have a fever.  You cannot swallow, drink, or eat. This information is not intended to replace advice given to you by your health care provider. Make sure you discuss any questions you have with your health care provider. Document Released: 03/15/2004  Document Revised: 07/14/2015 Document Reviewed: 06/02/2014 Elsevier Interactive Patient Education  2018 [employer]     

## 2018-12-09 IMAGING — DX DG CHEST 2V
2 series · 2 of 2 positions shown · non-contrast
Comparison: None.

CLINICAL DATA: Chest pain

EXAM:
CHEST - 2 VIEW

[chest pa]
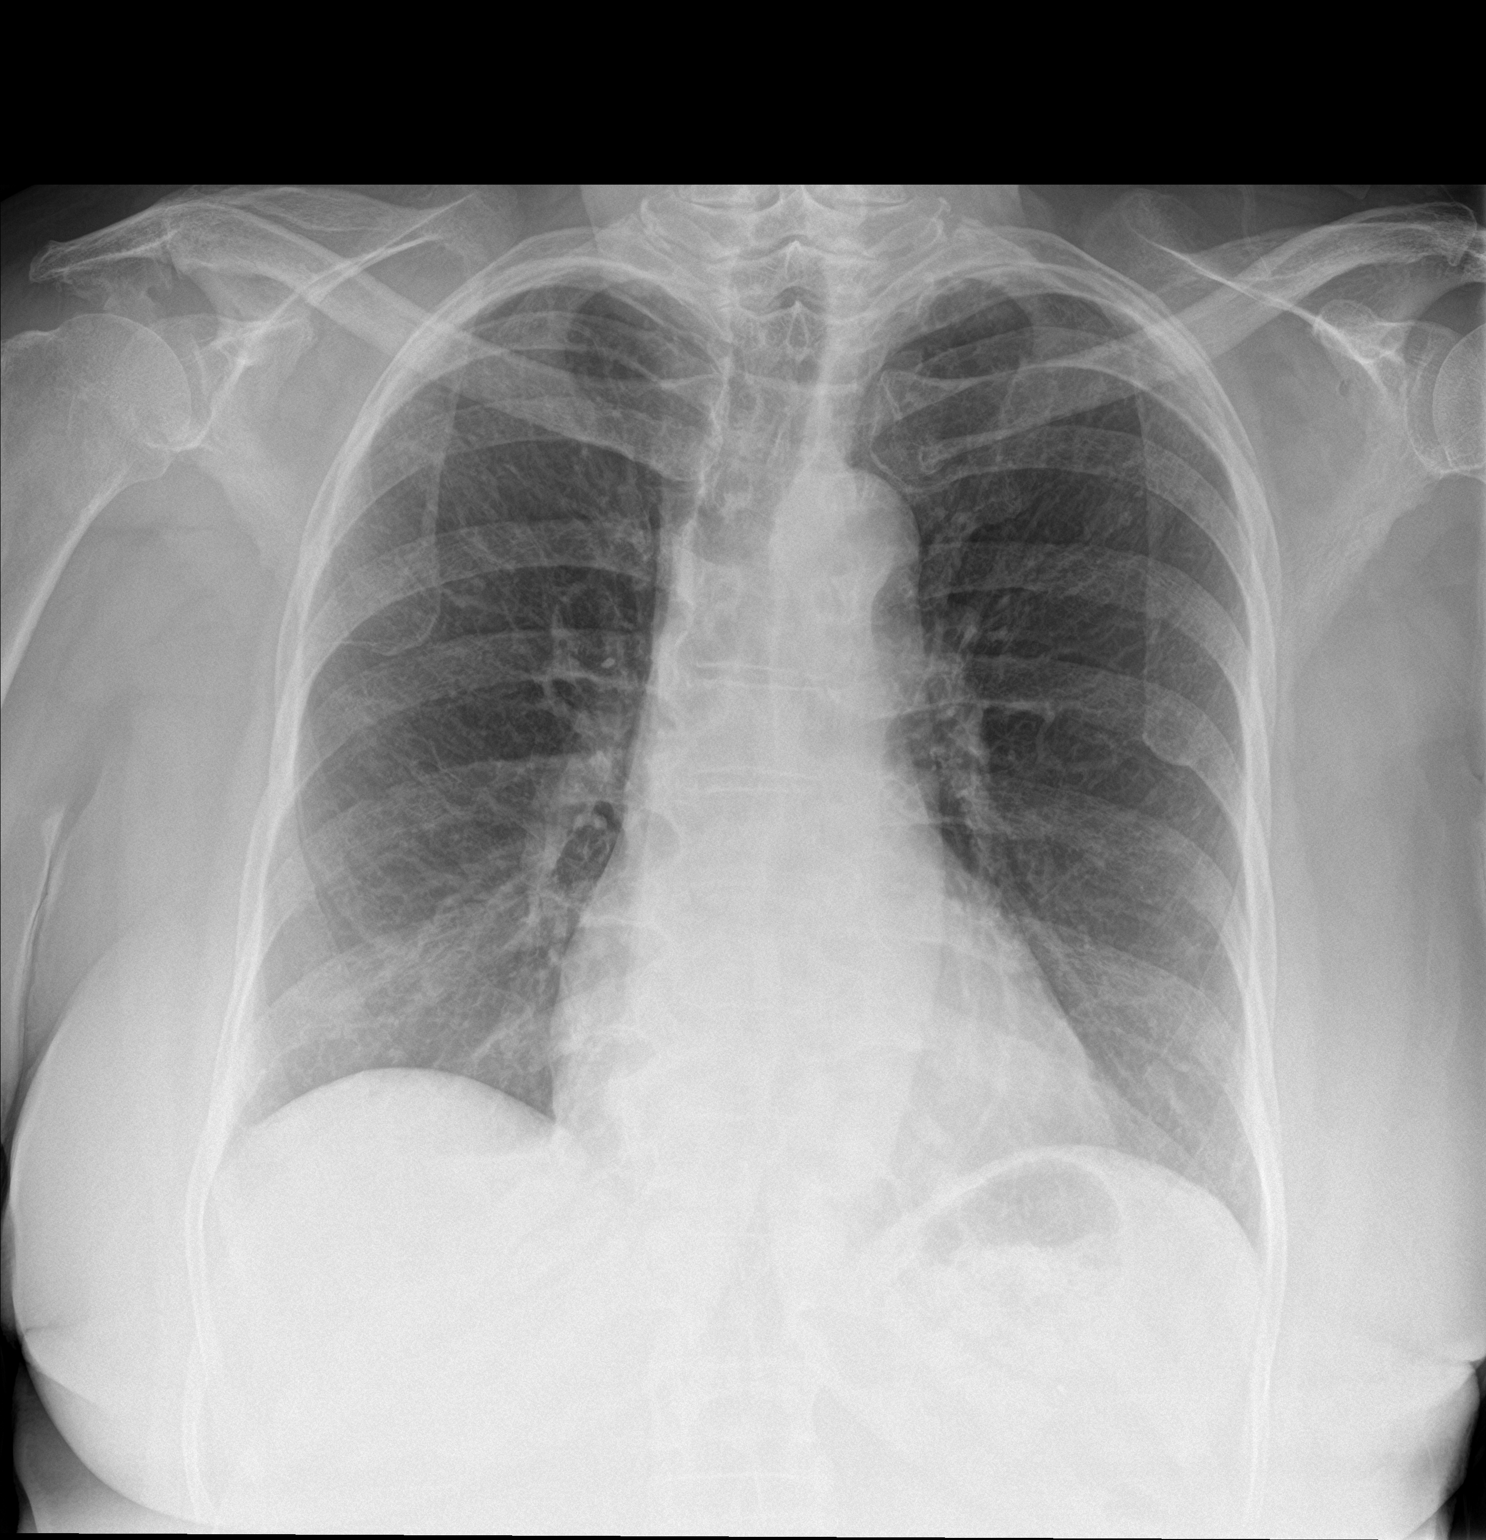

[chest lat]
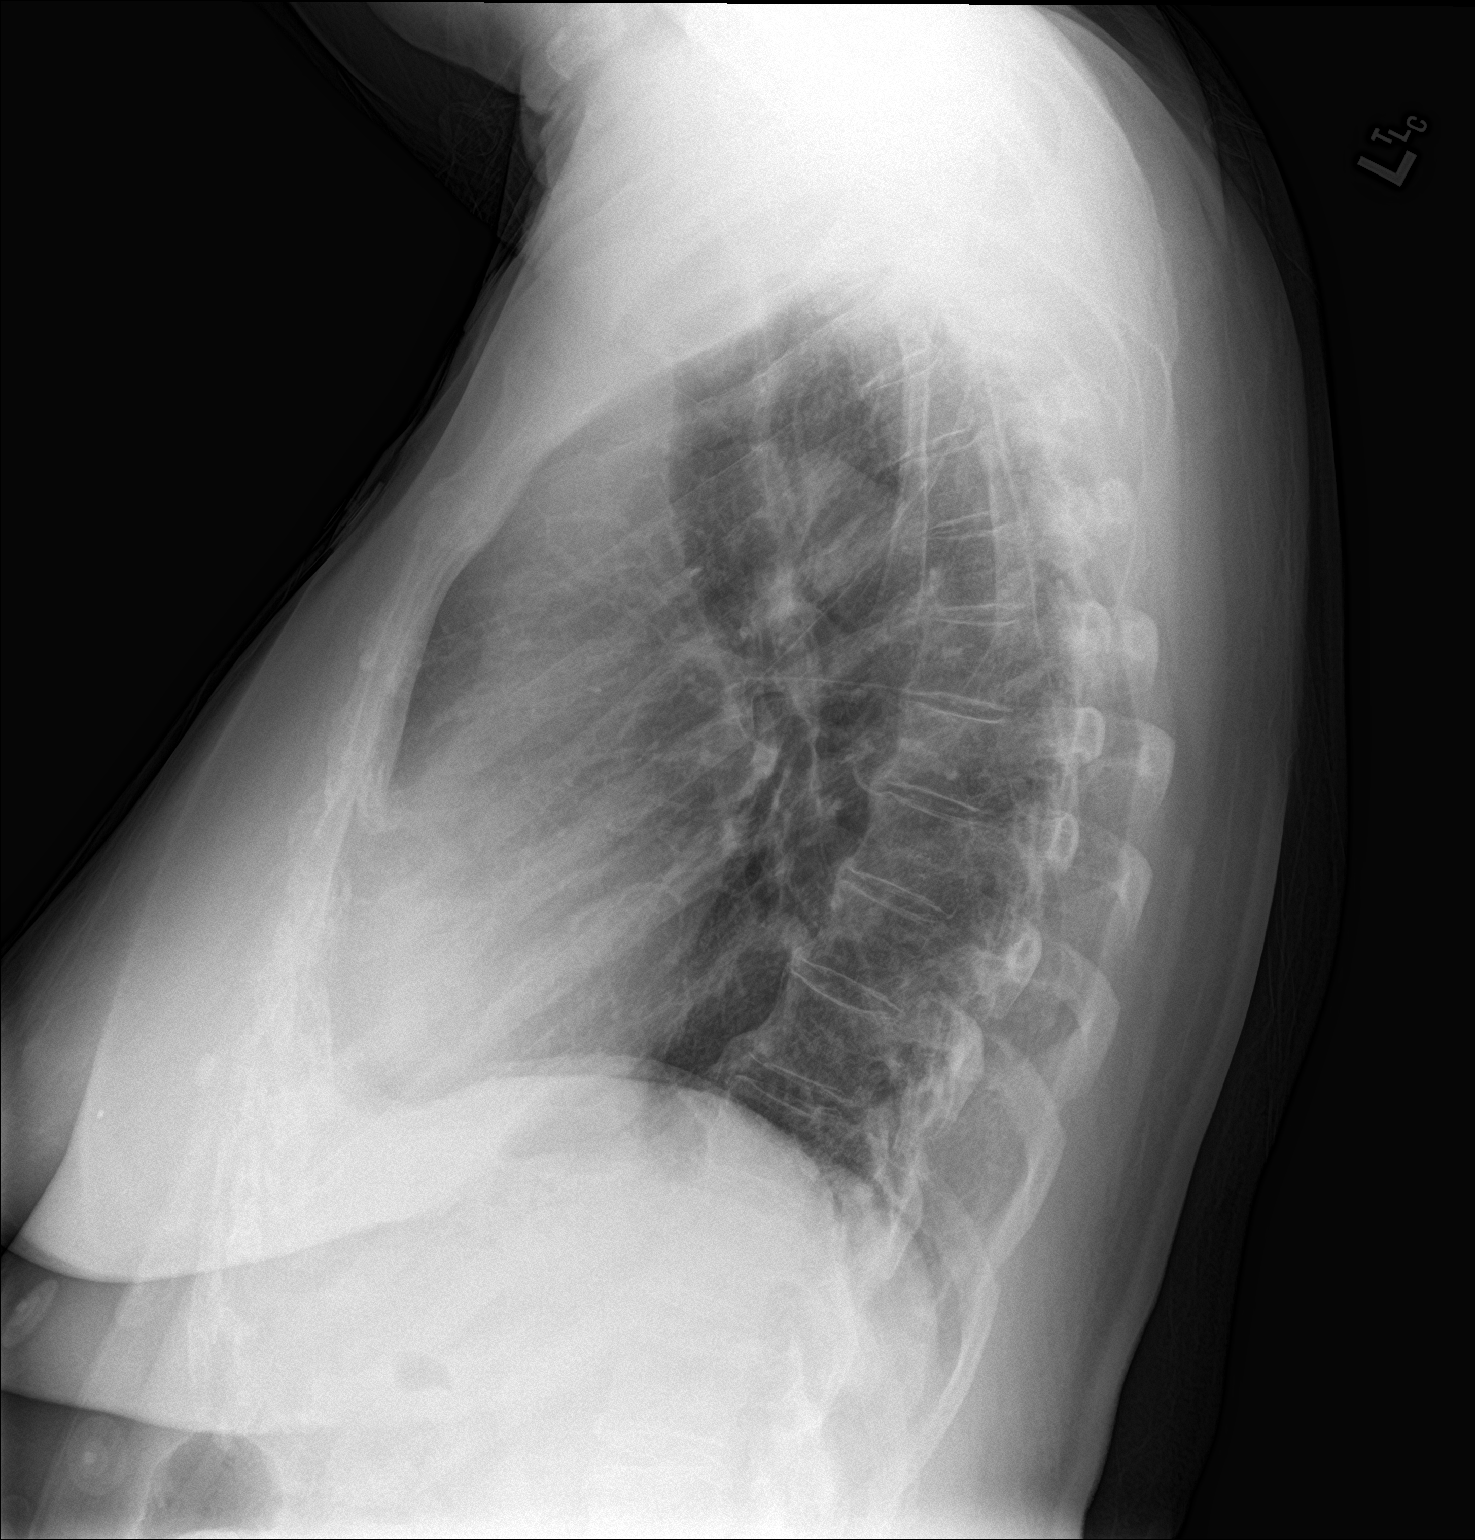

[2 of 2 positions shown; findings below may reference images not displayed]

FINDINGS: The heart size and mediastinal contours are within normal limits.
Both lungs are clear. Mild degenerative changes of the spine.
Prominent spur or possible loose body inferior to the right
acromion.
IMPRESSION: No active cardiopulmonary disease.

## 2022-05-21 DEATH — deceased
